# Patient Record
Sex: Female | Born: 1955 | Race: White | Hispanic: No | Smoking: Current every day smoker
Health system: Southern US, Community
[De-identification: ages and names within clinical notes are randomized; demographics above are authoritative.]

## PROBLEM LIST (undated history)

## (undated) DIAGNOSIS — M51369 Other intervertebral disc degeneration, lumbar region without mention of lumbar back pain or lower extremity pain: Secondary | ICD-10-CM

## (undated) DIAGNOSIS — I2510 Atherosclerotic heart disease of native coronary artery without angina pectoris: Secondary | ICD-10-CM

## (undated) DIAGNOSIS — I219 Acute myocardial infarction, unspecified: Secondary | ICD-10-CM

## (undated) DIAGNOSIS — J189 Pneumonia, unspecified organism: Secondary | ICD-10-CM

## (undated) DIAGNOSIS — E785 Hyperlipidemia, unspecified: Secondary | ICD-10-CM

## (undated) DIAGNOSIS — I639 Cerebral infarction, unspecified: Secondary | ICD-10-CM

## (undated) DIAGNOSIS — J449 Chronic obstructive pulmonary disease, unspecified: Secondary | ICD-10-CM

## (undated) DIAGNOSIS — G8929 Other chronic pain: Secondary | ICD-10-CM

## (undated) DIAGNOSIS — R42 Dizziness and giddiness: Secondary | ICD-10-CM

## (undated) DIAGNOSIS — Z9289 Personal history of other medical treatment: Secondary | ICD-10-CM

## (undated) DIAGNOSIS — M5136 Other intervertebral disc degeneration, lumbar region: Secondary | ICD-10-CM

## (undated) DIAGNOSIS — I10 Essential (primary) hypertension: Secondary | ICD-10-CM

## (undated) DIAGNOSIS — J45909 Unspecified asthma, uncomplicated: Secondary | ICD-10-CM

## (undated) DIAGNOSIS — M549 Dorsalgia, unspecified: Secondary | ICD-10-CM

## (undated) HISTORY — DX: Hyperlipidemia, unspecified: E78.5

## (undated) HISTORY — PX: CARPAL TUNNEL RELEASE: SHX101

## (undated) HISTORY — PX: CORONARY ANGIOPLASTY WITH STENT PLACEMENT: SHX49

## (undated) HISTORY — DX: Cerebral infarction, unspecified: I63.9

## (undated) HISTORY — DX: Unspecified asthma, uncomplicated: J45.909

## (undated) HISTORY — PX: ECTOPIC PREGNANCY SURGERY: SHX613

---

## 2012-07-09 ENCOUNTER — Encounter (HOSPITAL_COMMUNITY): Payer: Self-pay | Admitting: Emergency Medicine

## 2012-07-09 ENCOUNTER — Emergency Department (HOSPITAL_COMMUNITY): Payer: Medicare Other

## 2012-07-09 ENCOUNTER — Emergency Department (HOSPITAL_COMMUNITY)
Admission: EM | Admit: 2012-07-09 | Discharge: 2012-07-09 | Disposition: A | Discharge status: Home / Self Care | Payer: Medicare Other | Attending: Emergency Medicine | Admitting: Emergency Medicine

## 2012-07-09 DIAGNOSIS — Z8701 Personal history of pneumonia (recurrent): Secondary | ICD-10-CM | POA: Insufficient documentation

## 2012-07-09 DIAGNOSIS — Y939 Activity, unspecified: Secondary | ICD-10-CM | POA: Insufficient documentation

## 2012-07-09 DIAGNOSIS — Z87891 Personal history of nicotine dependence: Secondary | ICD-10-CM | POA: Insufficient documentation

## 2012-07-09 DIAGNOSIS — T07XXXA Unspecified multiple injuries, initial encounter: Secondary | ICD-10-CM | POA: Insufficient documentation

## 2012-07-09 DIAGNOSIS — I252 Old myocardial infarction: Secondary | ICD-10-CM | POA: Insufficient documentation

## 2012-07-09 DIAGNOSIS — W19XXXA Unspecified fall, initial encounter: Secondary | ICD-10-CM

## 2012-07-09 DIAGNOSIS — R51 Headache: Secondary | ICD-10-CM | POA: Insufficient documentation

## 2012-07-09 DIAGNOSIS — J449 Chronic obstructive pulmonary disease, unspecified: Secondary | ICD-10-CM | POA: Insufficient documentation

## 2012-07-09 DIAGNOSIS — Y9289 Other specified places as the place of occurrence of the external cause: Secondary | ICD-10-CM | POA: Insufficient documentation

## 2012-07-09 DIAGNOSIS — J4489 Other specified chronic obstructive pulmonary disease: Secondary | ICD-10-CM | POA: Insufficient documentation

## 2012-07-09 DIAGNOSIS — W010XXA Fall on same level from slipping, tripping and stumbling without subsequent striking against object, initial encounter: Secondary | ICD-10-CM | POA: Insufficient documentation

## 2012-07-09 DIAGNOSIS — Z9861 Coronary angioplasty status: Secondary | ICD-10-CM | POA: Insufficient documentation

## 2012-07-09 HISTORY — DX: Pneumonia, unspecified organism: J18.9

## 2012-07-09 HISTORY — DX: Chronic obstructive pulmonary disease, unspecified: J44.9

## 2012-07-09 HISTORY — DX: Acute myocardial infarction, unspecified: I21.9

## 2012-07-09 MED ORDER — NAPROXEN 500 MG PO TABS: 500.0000 mg | ORAL_TABLET | Freq: Two times a day (BID) | ORAL | Status: DC

## 2012-07-09 MED ORDER — TRAMADOL HCL 50 MG PO TABS
50.0000 mg | ORAL_TABLET | Freq: Once | ORAL | Status: AC
  Administered 2012-07-09: 50 mg via ORAL
  Filled 2012-07-09: qty 1

## 2012-07-09 MED ORDER — OXYCODONE-ACETAMINOPHEN 5-325 MG PO TABS
1.0000 | ORAL_TABLET | Freq: Once | ORAL | Status: DC
  Filled 2012-07-09: qty 1

## 2012-07-09 MED ORDER — OXYCODONE-ACETAMINOPHEN 5-325 MG PO TABS: 1.0000 | ORAL_TABLET | ORAL | Status: AC | PRN

## 2012-07-09 NOTE — ED Notes (Signed)
[facility]. States she slipped in the shower. C/o pain to the right side of her body.

## 2012-07-09 NOTE — ED Notes (Addendum)
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

## 2012-07-09 NOTE — ED Notes (Signed)
States that she continues with a headache, medication had no effect.  States that she is having a burning pain that runs down her right leg from her hip area.

## 2012-07-09 NOTE — ED Provider Notes (Signed)
History   This chart was scribed for Dione Booze, MD scribed by Magnus Sinning. The patient was seen in room [facility]03/[facility]03 at 18:33  CSN: [id number]  Arrival date & time 07/09/12  1816    Chief Complaint  Patient presents with  . Fall    (Consider location/radiation/quality/duration/timing/severity/associated sxs/prior treatment) HPI Comments: Vanessa Yoder is a 56 y.o. female who presents to the Emergency Department complaining of constnat moderate shooting right sided leg pain, onset today after a fall. She says the pain shoots from her right leg up into her right back. The [facility] and she says she slipped on some soap and landed on her right side. She currently rates the pain a 10 out of 10 and she states she has been unable to walk. 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   Patient is a 56 y.o. female presenting with fall. The history is provided by the patient. No language interpreter was used.  Fall The accident occurred 1 to 2 hours ago. The fall occurred while standing. She fell from an unknown height. There was no blood loss. The pain is at a severity of 10/10. The pain is moderate. She was not ambulatory at the scene. She has tried nothing for the symptoms.    Past Medical History  Diagnosis Date  . MI (myocardial infarction)   . COPD (chronic obstructive pulmonary disease)   . Pneumonia     Past Surgical History  Procedure Date  . Coronary angioplasty with stent placement   . Ectopic pregnancy surgery   . Cesarean section   . Carpal tunnel release     No family history on file.  History  Substance Use Topics  . Smoking status: Former Games developer  . Smokeless tobacco: Not on file  . Alcohol Use: No   Review of Systems  All other systems reviewed and are negative.   10 Systems reviewed and are negative for acute change except as noted in the HPI. Allergies  Demerol and Penicillins  Home Medications  No current  outpatient prescriptions on file.  BP 146/85  Pulse 73  Temp 97.5 F (36.4 C) (Oral)  Resp 20  Ht 5\' 3"  (1.6 m)  Wt 160 lb (72.576 kg)  BMI 28.34 kg/m2  SpO2 100%  Physical Exam  Nursing note and vitals reviewed. Constitutional: She is oriented to person, place, and time. She appears well-developed and well-nourished. No distress.  HENT:  Head: Normocephalic and atraumatic.  Eyes: Conjunctivae normal and EOM are normal.  Neck: Neck supple. No tracheal deviation present.  Cardiovascular: Normal rate.   Pulmonary/Chest: Effort normal. No respiratory distress.  Abdominal: She exhibits no distension.  Musculoskeletal: Normal range of motion. She exhibits tenderness.       Tender over entire thoracic and lumbar spine. Tender diffusely at the right leg. Pain with ROM of the right hip, knee and ankle. No swelling and no deformity  Neurological: She is alert and oriented to person, place, and time. No sensory deficit.  Skin: Skin is warm and dry.  Psychiatric: She has a normal mood and affect. Her behavior is normal.    ED Course  Procedures (including critical care time) DIAGNOSTIC STUDIES: Oxygen Saturation is 100% on room air, normal by my interpretation.    COORDINATION OF CARE: 18:35: Physical exam performed.  Dg Thoracic Spine 2 View  07/09/2012  *RADIOLOGY REPORT*  Clinical Data: Back pain following a fall.  THORACIC SPINE -  2 VIEW  Comparison: None.  Findings: Minimal anterior spur formation at multiple levels.  No fractures or subluxations.  Mild cervical spine degenerative changes.  IMPRESSION: No fracture or subluxation.  Mild degenerative changes.   Original Report Authenticated By: Beckie Salts, M.D.    Dg Lumbar Spine Complete  07/09/2012  *RADIOLOGY REPORT*  Clinical Data: Back pain following a fall.  LUMBAR SPINE - COMPLETE 4+ VIEW  Comparison: None.  Findings: Five non-rib bearing lumbar vertebrae.  Minimal levoconvex scoliosis.  No fractures, pars defects or  subluxations.  IMPRESSION: No fracture or subluxation.   Original Report Authenticated By: Beckie Salts, M.D.    Dg Pelvis 1-2 Views  07/09/2012  *RADIOLOGY REPORT*  Clinical Data: Right hip pain following a fall.  PELVIS - 1-2 VIEW  Comparison: None.  Findings: Mild pubic bone sclerosis and spur formation.  No fracture or dislocation.  IMPRESSION: No fracture or dislocation.   Original Report Authenticated By: Beckie Salts, M.D.    Dg Femur Right  07/09/2012  *RADIOLOGY REPORT*  Clinical Data: Right hip and upper femur pain.  RIGHT FEMUR - 2 VIEW  Comparison: None.  Findings: The right hip and knee are located.  No acute bone or soft tissue abnormality is present.  IMPRESSION: Negative right femur.   Original Report Authenticated By: Marin Roberts, M.D.    Dg Tibia/fibula Right  07/09/2012  *RADIOLOGY REPORT*  Clinical Data: Fall.  Pain.  RIGHT TIBIA AND FIBULA - 2 VIEW  Comparison: Femur films of the same day.  Findings: The knee and ankle joints are located.  No acute bone or soft tissue abnormality is present.  IMPRESSION: Negative right tibia and fibula.   Original Report Authenticated By: Marin Roberts, M.D.     Images viewed by me.    1. Fall   2. Contusion, multiple sites       MDM  Fall without evidence of serious injury. X-rays of been ordered. Patient started complaining of a headache and was worried that she was having another stroke, so CT scan of the head was obtained. All x-rays have come back negative for fractures lined any acute process. She is sent home with prescriptions for naproxen and Percocet.  I personally performed the services described in this documentation, which was scribed in my presence. The recorded information has been reviewed and is accurate.           Dione Booze, MD 07/09/12 2053

## 2012-07-09 NOTE — ED Notes (Signed)
Officer states that the patients are not allowed to have narcotic pain medications, MD made aware and Rx for Percocet voided.

## 2012-12-15 ENCOUNTER — Emergency Department (HOSPITAL_COMMUNITY): Payer: Medicare Other

## 2012-12-15 ENCOUNTER — Encounter (HOSPITAL_COMMUNITY): Payer: Self-pay | Admitting: *Deleted

## 2012-12-15 ENCOUNTER — Emergency Department (HOSPITAL_COMMUNITY)
Admission: EM | Admit: 2012-12-15 | Discharge: 2012-12-15 | Disposition: A | Discharge status: Home / Self Care | Payer: Medicare Other | Attending: Emergency Medicine | Admitting: Emergency Medicine

## 2012-12-15 DIAGNOSIS — Z7902 Long term (current) use of antithrombotics/antiplatelets: Secondary | ICD-10-CM | POA: Insufficient documentation

## 2012-12-15 DIAGNOSIS — J4489 Other specified chronic obstructive pulmonary disease: Secondary | ICD-10-CM | POA: Insufficient documentation

## 2012-12-15 DIAGNOSIS — Z9861 Coronary angioplasty status: Secondary | ICD-10-CM | POA: Insufficient documentation

## 2012-12-15 DIAGNOSIS — Z8701 Personal history of pneumonia (recurrent): Secondary | ICD-10-CM | POA: Insufficient documentation

## 2012-12-15 DIAGNOSIS — L509 Urticaria, unspecified: Secondary | ICD-10-CM | POA: Insufficient documentation

## 2012-12-15 DIAGNOSIS — L03311 Cellulitis of abdominal wall: Secondary | ICD-10-CM

## 2012-12-15 DIAGNOSIS — Z79899 Other long term (current) drug therapy: Secondary | ICD-10-CM | POA: Insufficient documentation

## 2012-12-15 DIAGNOSIS — H60399 Other infective otitis externa, unspecified ear: Secondary | ICD-10-CM | POA: Insufficient documentation

## 2012-12-15 DIAGNOSIS — L02219 Cutaneous abscess of trunk, unspecified: Secondary | ICD-10-CM | POA: Insufficient documentation

## 2012-12-15 DIAGNOSIS — I252 Old myocardial infarction: Secondary | ICD-10-CM | POA: Insufficient documentation

## 2012-12-15 DIAGNOSIS — Z87891 Personal history of nicotine dependence: Secondary | ICD-10-CM | POA: Insufficient documentation

## 2012-12-15 DIAGNOSIS — H6011 Cellulitis of right external ear: Secondary | ICD-10-CM

## 2012-12-15 DIAGNOSIS — J449 Chronic obstructive pulmonary disease, unspecified: Secondary | ICD-10-CM | POA: Insufficient documentation

## 2012-12-15 DIAGNOSIS — Z880 Allergy status to penicillin: Secondary | ICD-10-CM | POA: Insufficient documentation

## 2012-12-15 DIAGNOSIS — H81399 Other peripheral vertigo, unspecified ear: Secondary | ICD-10-CM | POA: Insufficient documentation

## 2012-12-15 LAB — POCT I-STAT, CHEM 8
Calcium, Ion: 1.19 mmol/L (ref 1.12–1.23)
Glucose, Bld: 105 mg/dL — ABNORMAL HIGH (ref 70–99)
HCT: 42 % (ref 36.0–46.0)
Hemoglobin: 14.3 g/dL (ref 12.0–15.0)

## 2012-12-15 MED ORDER — HYDROCODONE-ACETAMINOPHEN 5-325 MG PO TABS: ORAL_TABLET | ORAL | Status: DC

## 2012-12-15 MED ORDER — MECLIZINE HCL 12.5 MG PO TABS
25.0000 mg | ORAL_TABLET | Freq: Once | ORAL | Status: AC
  Administered 2012-12-15: 25 mg via ORAL
  Filled 2012-12-15: qty 2

## 2012-12-15 MED ORDER — MECLIZINE HCL 25 MG PO TABS: 25.0000 mg | ORAL_TABLET | Freq: Three times a day (TID) | ORAL | Status: DC | PRN

## 2012-12-15 MED ORDER — DIAZEPAM 5 MG PO TABS
5.0000 mg | ORAL_TABLET | Freq: Once | ORAL | Status: AC
  Administered 2012-12-15: 5 mg via ORAL
  Filled 2012-12-15: qty 1

## 2012-12-15 MED ORDER — OXYCODONE-ACETAMINOPHEN 5-325 MG PO TABS
1.0000 | ORAL_TABLET | Freq: Once | ORAL | Status: AC
  Administered 2012-12-15: 1 via ORAL
  Filled 2012-12-15: qty 1

## 2012-12-15 MED ORDER — DOXYCYCLINE HYCLATE 100 MG PO TABS: 100.0000 mg | ORAL_TABLET | Freq: Two times a day (BID) | ORAL | Status: DC

## 2012-12-15 MED ORDER — CLINDAMYCIN HCL 150 MG PO CAPS: ORAL_CAPSULE | ORAL | Status: DC

## 2012-12-15 NOTE — ED Notes (Signed)
Pt refusing CT due to claustrophobia.  edp notified.

## 2012-12-15 NOTE — ED Notes (Signed)
Pt given water and crackers per request

## 2012-12-15 NOTE — ED Provider Notes (Signed)
History     CSN: [id number]  Arrival date & time 12/15/12  0930   First MD Initiated Contact with Patient 12/15/12 [phone]      Chief Complaint  Patient presents with  . Rash  . Dizziness     HPI Pt was seen at 1000.   Per pt, c/o gradual onset and persistence of constant right and left ears "rash" that began 1 week ago.  Pt also c/o rash to lower abd wall that began several days ago, as well as "a burning sensation all over my body." States the rash began as "an itching spot" on her left ear, then "moved to my right ear" and "then down to my abd." Describes the rash as "itchy." Pt also c/o gradual onset and persistence of multiple intermittent episodes of "dizziness" for the past several days.  Pt describes the dizziness as "everything is moving around," worsens when she changes body or head positions.  Denies blistering rash to body or in mouth.  Denies fevers, no ecchymosis, no ears pain, no sore throat, no intra-oral lesions/rash/edema, no CP/SOB, no abd pain, no N/V/D, no focal motor weakness, no tingling/numbness in extremities, no visual changes, no facial droop, no slurred speech, no dysphagia, no neck pain.     Past Medical History  Diagnosis Date  . MI (myocardial infarction)   . COPD (chronic obstructive pulmonary disease)   . Pneumonia     Past Surgical History  Procedure Laterality Date  . Coronary angioplasty with stent placement    . Ectopic pregnancy surgery    . Cesarean section    . Carpal tunnel release       History  Substance Use Topics  . Smoking status: Former Games developer  . Smokeless tobacco: Not on file  . Alcohol Use: No    Review of Systems ROS: Statement: All systems negative except as marked or noted in the HPI; Constitutional: Negative for fever and chills. ; ; Eyes: Negative for eye pain, redness and discharge. ; ; ENMT: Negative for ear pain, hoarseness, nasal congestion, sinus pressure and sore throat. ; ; Cardiovascular: Negative for chest pain,  palpitations, diaphoresis, dyspnea and peripheral edema. ; ; Respiratory: Negative for cough, wheezing and stridor. ; ; Gastrointestinal: Negative for nausea, vomiting, diarrhea, abdominal pain, blood in stool, hematemesis, jaundice and rectal bleeding. . ; ; Genitourinary: Negative for dysuria, flank pain and hematuria. ; ; Musculoskeletal: Negative for back pain and neck pain. Negative for swelling and trauma.; ; Skin: +itching rash. Negative for abrasions, blisters, bruising and skin lesion.; ; Neuro: +dizziness, "burning all over body."  Negative for headache, lightheadedness and neck stiffness. Negative for weakness, altered level of consciousness , altered mental status, extremity weakness, involuntary movement, seizure and syncope.     Allergies  Demerol and Penicillins  Home Medications   Current Outpatient Rx  Name  Route  Sig  Dispense  Refill  . clopidogrel (PLAVIX) 75 MG tablet   Oral   Take 75 mg by mouth daily.         . furosemide (LASIX) 20 MG tablet   Oral   Take 20 mg by mouth daily.         Marland Kitchen lisinopril (PRINIVIL,ZESTRIL) 2.5 MG tablet   Oral   Take 2.5 mg by mouth daily.         . metoprolol tartrate (LOPRESSOR) 25 MG tablet   Oral   Take 25 mg by mouth 2 (two) times daily.         Marland Kitchen  montelukast (SINGULAIR) 10 MG tablet   Oral   Take 10 mg by mouth at bedtime.         . naproxen sodium (ANAPROX) 220 MG tablet   Oral   Take 220 mg by mouth daily as needed (pain).         . simvastatin (ZOCOR) 10 MG tablet   Oral   Take 10 mg by mouth at bedtime.         . clindamycin (CLEOCIN) 150 MG capsule      3 tabs PO TID x 10 days   90 capsule   0   . HYDROcodone-acetaminophen (NORCO/VICODIN) 5-325 MG per tablet      1 or 2 tabs PO q6 hours prn pain   20 tablet   0   . meclizine (ANTIVERT) 25 MG tablet   Oral   Take 1 tablet (25 mg total) by mouth 3 (three) times daily as needed for dizziness.   15 tablet   0     BP 132/80  Pulse 73   Temp(Src) 97.8 F (36.6 C) (Oral)  Ht 5\' 3"  (1.6 m)  Wt 150 lb (68.04 kg)  BMI 26.58 kg/m2  SpO2 97%  Physical Exam 1005: Physical examination:  Nursing notes reviewed; Vital signs and O2 SAT reviewed;  Constitutional: Well developed, Well nourished, Well hydrated, In no acute distress; Head:  Normocephalic, atraumatic; Eyes: EOMI, PERRL, No scleral icterus; ENMT: TM's clear bilat. Bilateral ear canals without rash, edema, debris or drainage.  Mouth and pharynx normal, Mucous membranes moist; Neck: Supple, Full range of motion, No lymphadenopathy; Cardiovascular: Regular rate and rhythm, No murmur, rub, or gallop; Respiratory: Breath sounds clear & equal bilaterally, No rales, rhonchi, wheezes.  Speaking full sentences with ease, Normal respiratory effort/excursion; Chest: Nontender, Movement normal; Abdomen: Soft, Nontender, Nondistended, Normal bowel sounds; Genitourinary: No CVA tenderness; Extremities: Pulses normal, No tenderness, No edema, No calf edema or asymmetry.; Neuro: AA&Ox3, Major CN grossly intact.  Strength 5/5 equal bilat UE's and LE's.  DTR 2/4 equal bilat UE's and LE's.  No gross sensory deficits.  Normal cerebellar testing bilat UE's (finger-nose) and LE's (heel-shin).  No pronator drift.  Speech clear.  No facial droop.  +right horizontal end-gaze fatigable nystagmus.;; Skin: Color normal, Warm, Dry; +urticarial rash that pt is scratching at during my exam to right side of head, posterior to right ear, lateral and anterior right neck without blisters, no open wounds, no drainage, no ecchymosis.  +3 small distinct areas of erythema to bilat lower anterior abd wall, no fluctuance, no drainage, no open wounds, no ecchymosis.  +left tragus with small scab.  +entire right pinnae and tragus with multiple scattered scabs, mild localized erythema, no blisters, no open wounds, no drainage, no ecchymosis.    ED Course  Procedures   MDM  MDM Reviewed: previous chart, nursing note and  vitals Interpretation: labs and CT scan   Results for orders placed during the hospital encounter of 12/15/12  POCT I-STAT, CHEM 8      Result Value Range   Sodium 138  135 - 145 mEq/L   Potassium 3.9  3.5 - 5.1 mEq/L   Chloride 105  96 - 112 mEq/L   BUN 13  6 - 23 mg/dL   Creatinine, Ser 1.61  0.50 - 1.10 mg/dL   Glucose, Bld 096 (*) 70 - 99 mg/dL   Calcium, Ion 0.45  4.09 - 1.23 mmol/L   TCO2 27  0 - 100 mmol/L  Hemoglobin 14.3  12.0 - 15.0 g/dL   HCT 86.5  78.4 - 69.6 %   Ct Head Wo Contrast 12/15/2012   *RADIOLOGY REPORT*  Clinical Data:  Dizziness  CT HEAD WITHOUT CONTRAST  Technique:  Contiguous axial images were obtained from the base of the skull through the vertex without contrast  Comparison:  07/09/2012  Findings:  The brain has a normal appearance without evidence for hemorrhage, acute infarction, hydrocephalus, or mass lesion.  There is no extra axial fluid collection.  The skull and paranasal sinuses are normal.  IMPRESSION: Normal CT of the head without contrast.   Original Report Authenticated By: Janeece Riggers, M.D.    1400:  Pt given antivert and valium (for anxiety before CT scan). States she feels "better now" and wants to go home.  Pt has ambulated with steady gait.  Not orthostatic.  Neuro exam continues intact.  Will tx symptomatically for vertigo.  Has tol PO well while in the ED without N/V.  Pt describes the right ear "pain" as "itching."  States she has "burning that radiates down and all over my body."  Right ear, as well as abd wall lesions, appears crusted and cellulitic. No blisters, no open wounds, no drainage, no abscess. Rash to right head and neck appears urticarial, no blisters.  Ears rash does not appear to be external otitis, as bilat ear canals are without rash, edema, debris, or drainage.  Doubt shingles without hx of or currently visualized blisters, no facial nerve palsy, no rash in ear canal itself, as well as rash is not in/has not been in a unilateral  dermatomal distribution (has been bilat ears, bilat lower anterior abd wall).   Will tx symptomatically for cellulitis and vertigo at this time.  Pt has gotten herself dressed and wants to go home now. Dx and testing d/w pt and family.  Questions answered.  Verb understanding, agreeable to d/c home with outpt f/u.          Laray Anger, DO 12/18/12 2224

## 2012-12-15 NOTE — ED Notes (Signed)
Rash to right ear with drainage at times. Also small rash to lower abdomen. Pt states deep, burning sensation all over her body. Balance has also been off, per pt.

## 2012-12-15 NOTE — ED Notes (Signed)
Pt initially said she seen stars when she got up but stated that dizziness was better. Pt said she felt that she ambulated fine. She then stated,"I don't care, I just want to get out of here and go home." NAD. Pt ambulated fine earlier from triage also.

## 2012-12-15 NOTE — ED Notes (Signed)
Patient walked in the hallway.  Patient could ambulate without assistance until "a episode of dizziness" - then she would require assistance. Patient stated that "dizziness comes and goes".

## 2012-12-15 NOTE — ED Notes (Signed)
Pt requesting something for pain.  edp notified, no orders received.

## 2012-12-27 ENCOUNTER — Emergency Department (HOSPITAL_COMMUNITY)
Admission: EM | Admit: 2012-12-27 | Discharge: 2012-12-27 | Disposition: A | Discharge status: Home / Self Care | Payer: Medicare Other | Attending: Emergency Medicine | Admitting: Emergency Medicine

## 2012-12-27 ENCOUNTER — Emergency Department (HOSPITAL_COMMUNITY): Payer: Medicare Other

## 2012-12-27 ENCOUNTER — Encounter (HOSPITAL_COMMUNITY): Payer: Self-pay | Admitting: *Deleted

## 2012-12-27 DIAGNOSIS — R110 Nausea: Secondary | ICD-10-CM | POA: Insufficient documentation

## 2012-12-27 DIAGNOSIS — Z885 Allergy status to narcotic agent status: Secondary | ICD-10-CM | POA: Insufficient documentation

## 2012-12-27 DIAGNOSIS — R0789 Other chest pain: Secondary | ICD-10-CM | POA: Insufficient documentation

## 2012-12-27 DIAGNOSIS — R0602 Shortness of breath: Secondary | ICD-10-CM | POA: Insufficient documentation

## 2012-12-27 DIAGNOSIS — J4489 Other specified chronic obstructive pulmonary disease: Secondary | ICD-10-CM | POA: Insufficient documentation

## 2012-12-27 DIAGNOSIS — Z8701 Personal history of pneumonia (recurrent): Secondary | ICD-10-CM | POA: Insufficient documentation

## 2012-12-27 DIAGNOSIS — Z8673 Personal history of transient ischemic attack (TIA), and cerebral infarction without residual deficits: Secondary | ICD-10-CM | POA: Insufficient documentation

## 2012-12-27 DIAGNOSIS — Z79899 Other long term (current) drug therapy: Secondary | ICD-10-CM | POA: Insufficient documentation

## 2012-12-27 DIAGNOSIS — I2510 Atherosclerotic heart disease of native coronary artery without angina pectoris: Secondary | ICD-10-CM | POA: Insufficient documentation

## 2012-12-27 DIAGNOSIS — R079 Chest pain, unspecified: Secondary | ICD-10-CM | POA: Insufficient documentation

## 2012-12-27 DIAGNOSIS — Z880 Allergy status to penicillin: Secondary | ICD-10-CM | POA: Insufficient documentation

## 2012-12-27 DIAGNOSIS — I252 Old myocardial infarction: Secondary | ICD-10-CM | POA: Insufficient documentation

## 2012-12-27 DIAGNOSIS — Z87891 Personal history of nicotine dependence: Secondary | ICD-10-CM | POA: Insufficient documentation

## 2012-12-27 DIAGNOSIS — J449 Chronic obstructive pulmonary disease, unspecified: Secondary | ICD-10-CM | POA: Insufficient documentation

## 2012-12-27 HISTORY — DX: Atherosclerotic heart disease of native coronary artery without angina pectoris: I25.10

## 2012-12-27 HISTORY — DX: Cerebral infarction, unspecified: I63.9

## 2012-12-27 LAB — CBC
HCT: 38.7 % (ref 36.0–46.0)
Hemoglobin: 14 g/dL (ref 12.0–15.0)
MCH: 32.9 pg (ref 26.0–34.0)
MCHC: 36.2 g/dL — ABNORMAL HIGH (ref 30.0–36.0)
RDW: 13.7 % (ref 11.5–15.5)

## 2012-12-27 LAB — BASIC METABOLIC PANEL
BUN: 16 mg/dL (ref 6–23)
Calcium: 9.4 mg/dL (ref 8.4–10.5)
GFR calc Af Amer: >90 mL/min (ref >90)
GFR calc non Af Amer: >90 mL/min (ref >90)
Glucose, Bld: 110 mg/dL — ABNORMAL HIGH (ref 70–99)

## 2012-12-27 MED ORDER — MORPHINE SULFATE 4 MG/ML IJ SOLN
4.0000 mg | Freq: Once | INTRAMUSCULAR | Status: AC
  Administered 2012-12-27: 4 mg via INTRAVENOUS
  Filled 2012-12-27: qty 1

## 2012-12-27 MED ORDER — ASPIRIN 81 MG PO CHEW
324.0000 mg | CHEWABLE_TABLET | Freq: Once | ORAL | Status: AC
  Administered 2012-12-27: 324 mg via ORAL
  Filled 2012-12-27: qty 4

## 2012-12-27 MED ORDER — ONDANSETRON HCL 4 MG/2ML IJ SOLN
4.0000 mg | Freq: Once | INTRAMUSCULAR | Status: AC
  Administered 2012-12-27: 4 mg via INTRAVENOUS
  Filled 2012-12-27: qty 2

## 2012-12-27 MED ORDER — CLOPIDOGREL BISULFATE 300 MG PO TABS
300.0000 mg | ORAL_TABLET | Freq: Once | ORAL | Status: AC
  Administered 2012-12-27: 300 mg via ORAL
  Filled 2012-12-27: qty 1

## 2012-12-27 MED ORDER — METOPROLOL SUCCINATE ER 50 MG PO TB24
50.0000 mg | ORAL_TABLET | Freq: Every day | ORAL | Status: DC
  Administered 2012-12-27: 50 mg via ORAL
  Filled 2012-12-27: qty 1

## 2012-12-27 MED ORDER — NITROGLYCERIN 0.4 MG SL SUBL
0.4000 mg | SUBLINGUAL_TABLET | SUBLINGUAL | Status: DC | PRN
  Administered 2012-12-27 (×2): 0.4 mg via SUBLINGUAL

## 2012-12-27 MED ORDER — GI COCKTAIL ~~LOC~~
30.0000 mL | Freq: Once | ORAL | Status: AC
  Administered 2012-12-27: 30 mL via ORAL
  Filled 2012-12-27: qty 30

## 2012-12-27 NOTE — ED Provider Notes (Signed)
History     CSN: [id number]  Arrival date & time 12/27/12  1731   First MD Initiated Contact with Patient 12/27/12 1751      Chief Complaint  Patient presents with  . Chest Pain  . Shortness of Breath    (Consider location/radiation/quality/duration/timing/severity/associated sxs/prior treatment) HPI  Patient is a 57 year old female past medical significant for MI (2009) with 3 stent placement (2009), CAD, COPD, history of DVT  presenting for three days of worsening central chest pressure with radiation to left arm with associated nausea and shortness of breath. Aggravating factors include movement, deep inspiration, palpation. No alleviating factors. Patient denies taking any aspirin. Patient states she has been off her Plavix for 10 days d/t antibiotic use. Patient also reports she has not followed up with cardiology since 2009 but was told she has another occluded vessel in her heart that would require stent placement. Denies diaphoresis, vomiting, new edema.   Past Medical History  Diagnosis Date  . MI (myocardial infarction)   . COPD (chronic obstructive pulmonary disease)   . Pneumonia   . Coronary artery disease   . Stroke     Past Surgical History  Procedure Laterality Date  . Coronary angioplasty with stent placement    . Ectopic pregnancy surgery    . Cesarean section    . Carpal tunnel release      No family history on file.  History  Substance Use Topics  . Smoking status: Former Games developer  . Smokeless tobacco: Not on file  . Alcohol Use: No    OB History   Grav Para Term Preterm Abortions TAB SAB Ect Mult Living                  Review of Systems  Constitutional: Negative for fever, chills and diaphoresis.  HENT: Negative for neck pain.   Eyes: Negative for visual disturbance.  Respiratory: Positive for chest tightness and shortness of breath. Negative for cough.   Cardiovascular: Positive for chest pain.  Gastrointestinal: Positive for nausea.  Negative for vomiting, abdominal pain and diarrhea.  Musculoskeletal: Negative for back pain.  Skin: Negative.   Neurological: Negative for headaches.  Psychiatric/Behavioral: The patient is not nervous/anxious.     Allergies  Demerol; Morphine and related; and Penicillins  Home Medications   Current Outpatient Rx  Name  Route  Sig  Dispense  Refill  . albuterol (PROVENTIL HFA;VENTOLIN HFA) 108 (90 BASE) MCG/ACT inhaler   Inhalation   Inhale 2 puffs into the lungs every 6 (six) hours as needed for wheezing.         Marland Kitchen atorvastatin (LIPITOR) 10 MG tablet   Oral   Take 10 mg by mouth daily.         . beclomethasone (QVAR) 80 MCG/ACT inhaler   Inhalation   Inhale 1 puff into the lungs 2 (two) times daily as needed (for shortness of breath).         . clindamycin (CLEOCIN) 150 MG capsule   Oral   Take 450 mg by mouth 3 (three) times daily.         . clopidogrel (PLAVIX) 75 MG tablet   Oral   Take 75 mg by mouth daily.         . furosemide (LASIX) 20 MG tablet   Oral   Take 20 mg by mouth daily.         Marland Kitchen lisinopril (PRINIVIL,ZESTRIL) 2.5 MG tablet   Oral   Take 2.5 mg by  mouth daily.         . meclizine (ANTIVERT) 25 MG tablet   Oral   Take 25 mg by mouth 3 (three) times daily as needed for dizziness.         . metoprolol succinate (TOPROL-XL) 50 MG 24 hr tablet   Oral   Take 50 mg by mouth daily. Take with or immediately following a meal.         . montelukast (SINGULAIR) 10 MG tablet   Oral   Take 10 mg by mouth at bedtime.         . naproxen sodium (ANAPROX) 220 MG tablet   Oral   Take 220 mg by mouth daily as needed (pain).           BP 119/53  Pulse 60  Temp(Src) 98.2 F (36.8 C) (Oral)  Resp 20  SpO2 96%  Physical Exam  Constitutional: She is oriented to person, place, and time. She appears well-developed and well-nourished. No distress.  HENT:  Head: Normocephalic and atraumatic.  Mouth/Throat: Oropharynx is clear and  moist.  Eyes: Conjunctivae are normal.  Neck: Neck supple.  Cardiovascular: Normal rate, regular rhythm, normal heart sounds and intact distal pulses.   Pulmonary/Chest: Effort normal and breath sounds normal. No respiratory distress. She exhibits tenderness.  Abdominal: Soft. Bowel sounds are normal. There is no tenderness.  Musculoskeletal: She exhibits no edema.  Neurological: She is alert and oriented to person, place, and time.  Skin: Skin is warm and dry. She is not diaphoretic. No erythema.    ED Course  Procedures (including critical care time)  Medications  nitroGLYCERIN (NITROSTAT) SL tablet 0.4 mg (0.4 mg Sublingual Given 12/27/12 1856)  metoprolol succinate (TOPROL-XL) 24 hr tablet 50 mg (50 mg Oral Given 12/27/12 1937)  aspirin chewable tablet 324 mg (324 mg Oral Given 12/27/12 1814)  morphine 4 MG/ML injection 4 mg (4 mg Intravenous Given 12/27/12 1856)  ondansetron (ZOFRAN) injection 4 mg (4 mg Intravenous Given 12/27/12 1856)  clopidogrel (PLAVIX) tablet 300 mg (300 mg Oral Given 12/27/12 1937)  morphine 4 MG/ML injection 4 mg (4 mg Intravenous Given 12/27/12 2011)  gi cocktail (Maalox,Lidocaine,Donnatal) (30 mLs Oral Given 12/27/12 2108)      Date: 12/27/2012  Rate: 75  Rhythm: normal sinus rhythm  QRS Axis: right  Intervals: normal  ST/T Wave abnormalities: normal  Conduction Disutrbances:none  Narrative Interpretation:   Old EKG Reviewed: none available    Labs Reviewed  CBC - Abnormal; Notable for the following:    MCHC 36.2 (*)    All other components within normal limits  BASIC METABOLIC PANEL - Abnormal; Notable for the following:    Glucose, Bld 110 (*)    All other components within normal limits  PRO B NATRIURETIC PEPTIDE  TROPONIN I  TROPONIN I  POCT I-STAT TROPONIN I   Dg Chest 2 View  12/27/2012   *RADIOLOGY REPORT*  Clinical Data: Chest pain, shortness of breath  CHEST - 2 VIEW  Comparison: 07/09/2012  Findings: Normal heart size and  vascularity.  Mild interstitial prominence without CHF or pneumonia.  Artifact overlies the upper lobes and the right lower chest.  No consolidation, pneumonia, edema, effusion or pneumothorax.  Trachea is midline.  IMPRESSION: No acute chest process.   Original Report Authenticated By: Judie Petit. Shick, M.D.     1. Chest pain       MDM  Patient is to be discharged with recommendation to follow up with PCP in regards  to today's hospital visit. Chest pain is not likely of cardiac or pulmonary etiology d/t presentation, VSS, no tracheal deviation, no JVD or new murmur, RRR, breath sounds equal bilaterally, EKG without acute abnormalities, negative troponins, and negative CXR. Cardiology evaluated patient and agreed patient could be discharged with follow up as outpatient. Pt has been advised to return to the ED is CP becomes exertional, associated with diaphoresis or nausea, radiates to left jaw/arm, worsens or becomes concerning in any way. Pt appears reliable for follow up and is agreeable to discharge. Case has been discussed with and seen by Dr. Manus Gunning who agrees with the above plan to discharge. Patient is stable at time of discharge          Jeannetta Ellis, PA-C 12/28/12 1055

## 2012-12-27 NOTE — ED Notes (Signed)
Rn to explain testing done in ED and emergency care and asked to follow up with cardiology. Pt sts she is upset about her pain still being present Dr. Manus Gunning explain testing to pt. RN explained negative troponins and pt told to come back with worsening symptoms. Pt sts she will never come back to a hospital in [place] again. RN apologetic.

## 2012-12-27 NOTE — Progress Notes (Signed)
Patient ID: Vanessa Yoder, female   DOB: Oct 02, 1955, 57 y.o.   MRN: [id number]   CARDIOLOGY CONSULT NOTE  Patient ID: Vanessa Yoder MRN: [id number], DOB/AGE: [birth year]   Admit date: 12/27/2012 Date of Consult: 12/27/2012   Primary Physician: Alice Reichert, MD Primary Cardiologist: none  Pt. Profile 57 year old white female with a history of coronary artery disease and chest discomfort for 3 days. I was asked by Dr. Manus Gunning to evaluate.   Problem List  Past Medical History  Diagnosis Date  . MI (myocardial infarction)   . COPD (chronic obstructive pulmonary disease)   . Pneumonia   . Coronary artery disease   . Stroke     Past Surgical History  Procedure Laterality Date  . Coronary angioplasty with stent placement    . Ectopic pregnancy surgery    . Cesarean section    . Carpal tunnel release       Allergies  Allergies  Allergen Reactions  . Demerol (Meperidine) Swelling  . Morphine And Related Hives and Rash  . Penicillins Rash    HPI   Patient comes in today with 3 days of constant chest discomfort. She describes as a sharp stabbing pain. She has not taken her Plavix in 10 days. She's also not taking any of her other heart medications as well. She says she was told not to take them with clindamycin by emergency room physician in [place]. I reviewed the records and do not see any note on that.  Her EKG is normal. First troponin is negative. Chest x-ray is significant for no acute cardiopulmonary disease.  She has a history of a myocardial infarction in [place] and says she has 3 stents. She continues to smoke on a daily basis.  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 This occurred in [place]. She states she obtained pain meds from [facility]. She states she is not a drug seeker.  Inpatient Medications  . metoprolol succinate  50 mg Oral Daily  .  morphine injection  4 mg Intravenous Once     Family History No family history on file.   Social History History   Social History  . Marital Status: Widowed    Spouse Name: N/A    Number of Children: N/A  . Years of Education: N/A   Occupational History  . Not on file.   Social History Main Topics  . Smoking status: Former Games developer  . Smokeless tobacco: Not on file  . Alcohol Use: No  . Drug Use: No  . Sexually Active: Not on file   Other Topics Concern  . Not on file   Social History Narrative  . No narrative on file     Review of Systems  General:  No chills, fever, night sweats or weight changes.  Cardiovascular:  No dyspnea on exertion, edema, orthopnea, palpitations, paroxysmal nocturnal dyspnea. Dermatological: No rash, lesions/masses Respiratory: No cough, dyspnea Urologic: No hematuria, dysuria Abdominal:   No nausea, vomiting, diarrhea, bright red blood per rectum, melena, or hematemesis Neurologic:  No visual changes, wkns, changes in mental status. All other systems reviewed and are otherwise negative except as noted above.  Physical Exam  Blood pressure 139/75, pulse 61, temperature 98.2 F (36.8 C), temperature source Oral, resp. rate 16, SpO2 96.00%.  General: Pleasant, NAD, skin warm and dry Psych: Normal affect. Neuro: Alert and oriented X 3. Moves all extremities spontaneously. HEENT: Normal  Neck: Supple without bruits or JVD. Lungs:  Resp regular and unlabored, inspiratory expiratory rhonchi Heart: RRR , normal S1-S2 Abdomen: Soft, non-tender, non-distended, BS + x 4.  Extremities: No clubbing, cyanosis or edema. DP/PT/Radials 2+ and equal bilaterally.  Labs   Recent Labs  12/27/12 1851  TROPONINI <0.30   Lab Results  Component Value Date   WBC 8.0 12/27/2012   HGB 14.0 12/27/2012   HCT 38.7 12/27/2012   MCV 91.1 12/27/2012   PLT 210 12/27/2012    Recent Labs Lab 12/27/12 1833  NA 137  K 3.8  CL 101  CO2 21  BUN 16  CREATININE 0.64  CALCIUM 9.4  GLUCOSE 110*   No  results found for this basename: CHOL, HDL, LDLCALC, TRIG   No results found for this basename: DDIMER    Radiology/Studies  Dg Chest 2 View  12/27/2012   *RADIOLOGY REPORT*  Clinical Data: Chest pain, shortness of breath  CHEST - 2 VIEW  Comparison: 07/09/2012  Findings: Normal heart size and vascularity.  Mild interstitial prominence without CHF or pneumonia.  Artifact overlies the upper lobes and the right lower chest.  No consolidation, pneumonia, edema, effusion or pneumothorax.  Trachea is midline.  IMPRESSION: No acute chest process.   Original Report Authenticated By: Judie Petit. Miles Costain, M.D.   Ct Head Wo Contrast  12/15/2012   *RADIOLOGY REPORT*  Clinical Data:  Dizziness  CT HEAD WITHOUT CONTRAST  Technique:  Contiguous axial images were obtained from the base of the skull through the vertex without contrast  Comparison:  07/09/2012  Findings:  The brain has a normal appearance without evidence for hemorrhage, acute infarction, hydrocephalus, or mass lesion.  There is no extra axial fluid collection.  The skull and paranasal sinuses are normal.  IMPRESSION: Normal CT of the head without contrast.   Original Report Authenticated By: Janeece Riggers, M.D.    ECG  Normal sinus rhythm, normal EKG  ASSESSMENT AND PLAN  #1 noncardiac chest pain. She has had 3 days of sharp stabbing pain it has a normal EKG and negative cardiac marker. Chest x-ray is also unremarkable. Discussed with Dr. Manus Gunning. We'll check one more cardiac enzymes and if negative will discharge home. Because she has not been taking her Plavix or other meds, we have loaded her with 300 mg of Plavix and given her her Toprol. She has also received aspirin. She's been strongly advised to stop smoking. I have also reinforced the importance of taking her meds on a daily basis. #2 tobacco use  #3 medical noncompliance  #4  appropriate use of pain medications. See history of present illness  Signed, Valera Castle, MD 12/27/2012, 8:09 PM

## 2012-12-27 NOTE — ED Notes (Signed)
RN entered to room to alert pt she was being discharged. Pt sts "I need to speak to the doctor because something is wrong with me and all hell will break loose if I get my son on the phone" Dr. Manus Gunning made aware.

## 2012-12-27 NOTE — ED Notes (Signed)
Pt has history of 3 cardiac stents and states that for the past three days has been having intermittent chest pain, sob, and nausea.

## 2012-12-27 NOTE — ED Notes (Signed)
Cardiologist- dr. Daleen Squibb at bedside.

## 2012-12-28 NOTE — ED Provider Notes (Signed)
Medical screening examination/treatment/procedure(s) were conducted as a shared visit with non-physician practitioner(s) and myself.  I personally evaluated the patient during the encounter  Constant chest pressure to L arm with nausea x 3 days.  Hx MI with stents placed in [place] in 2009. Has been off plavix.  EKG nsr.  Troponin negative. No tachycardia or hypoxia. D/w Dr. Daleen Squibb who has seen patient and agrees with serial enzymes and outpatient followup.  Glynn Octave, MD 12/28/12 1146

## 2013-01-07 ENCOUNTER — Encounter (HOSPITAL_COMMUNITY): Payer: Self-pay | Admitting: *Deleted

## 2013-01-07 ENCOUNTER — Emergency Department (HOSPITAL_COMMUNITY)
Admission: EM | Admit: 2013-01-07 | Discharge: 2013-01-07 | Disposition: A | Discharge status: Home / Self Care | Payer: Medicare Other | Attending: Emergency Medicine | Admitting: Emergency Medicine

## 2013-01-07 DIAGNOSIS — H60399 Other infective otitis externa, unspecified ear: Secondary | ICD-10-CM | POA: Insufficient documentation

## 2013-01-07 DIAGNOSIS — I2510 Atherosclerotic heart disease of native coronary artery without angina pectoris: Secondary | ICD-10-CM | POA: Insufficient documentation

## 2013-01-07 DIAGNOSIS — L989 Disorder of the skin and subcutaneous tissue, unspecified: Secondary | ICD-10-CM | POA: Insufficient documentation

## 2013-01-07 DIAGNOSIS — IMO0002 Reserved for concepts with insufficient information to code with codable children: Secondary | ICD-10-CM | POA: Insufficient documentation

## 2013-01-07 DIAGNOSIS — Z87891 Personal history of nicotine dependence: Secondary | ICD-10-CM | POA: Insufficient documentation

## 2013-01-07 DIAGNOSIS — M542 Cervicalgia: Secondary | ICD-10-CM | POA: Insufficient documentation

## 2013-01-07 DIAGNOSIS — Z79899 Other long term (current) drug therapy: Secondary | ICD-10-CM | POA: Insufficient documentation

## 2013-01-07 DIAGNOSIS — R599 Enlarged lymph nodes, unspecified: Secondary | ICD-10-CM | POA: Insufficient documentation

## 2013-01-07 DIAGNOSIS — J4489 Other specified chronic obstructive pulmonary disease: Secondary | ICD-10-CM | POA: Insufficient documentation

## 2013-01-07 DIAGNOSIS — Z792 Long term (current) use of antibiotics: Secondary | ICD-10-CM | POA: Insufficient documentation

## 2013-01-07 DIAGNOSIS — Z9861 Coronary angioplasty status: Secondary | ICD-10-CM | POA: Insufficient documentation

## 2013-01-07 DIAGNOSIS — H9209 Otalgia, unspecified ear: Secondary | ICD-10-CM | POA: Insufficient documentation

## 2013-01-07 DIAGNOSIS — R21 Rash and other nonspecific skin eruption: Secondary | ICD-10-CM | POA: Insufficient documentation

## 2013-01-07 DIAGNOSIS — Z8701 Personal history of pneumonia (recurrent): Secondary | ICD-10-CM | POA: Insufficient documentation

## 2013-01-07 DIAGNOSIS — Z8673 Personal history of transient ischemic attack (TIA), and cerebral infarction without residual deficits: Secondary | ICD-10-CM | POA: Insufficient documentation

## 2013-01-07 DIAGNOSIS — L089 Local infection of the skin and subcutaneous tissue, unspecified: Secondary | ICD-10-CM

## 2013-01-07 DIAGNOSIS — J449 Chronic obstructive pulmonary disease, unspecified: Secondary | ICD-10-CM | POA: Insufficient documentation

## 2013-01-07 DIAGNOSIS — I252 Old myocardial infarction: Secondary | ICD-10-CM | POA: Insufficient documentation

## 2013-01-07 DIAGNOSIS — Z7902 Long term (current) use of antithrombotics/antiplatelets: Secondary | ICD-10-CM | POA: Insufficient documentation

## 2013-01-07 DIAGNOSIS — Z880 Allergy status to penicillin: Secondary | ICD-10-CM | POA: Insufficient documentation

## 2013-01-07 DIAGNOSIS — H6093 Unspecified otitis externa, bilateral: Secondary | ICD-10-CM

## 2013-01-07 MED ORDER — HYDROCODONE-ACETAMINOPHEN 5-325 MG PO TABS: 1.0000 | ORAL_TABLET | ORAL | Status: DC | PRN

## 2013-01-07 MED ORDER — SULFAMETHOXAZOLE-TRIMETHOPRIM 800-160 MG PO TABS: 1.0000 | ORAL_TABLET | Freq: Two times a day (BID) | ORAL | Status: DC

## 2013-01-07 MED ORDER — NEOMYCIN-POLYMYXIN-HC 3.5-10000-1 OT SOLN
4.0000 [drp] | Freq: Once | OTIC | Status: AC
  Administered 2013-01-07: 4 [drp] via OTIC
  Filled 2013-01-07: qty 10

## 2013-01-07 NOTE — ED Provider Notes (Signed)
Medical screening examination/treatment/procedure(s) were conducted as a shared visit with non-physician practitioner(s) and myself.  I personally evaluated the patient during the encounter.  Right greater than left otitis externa and cellulitis right ear.  Rx Septra DS and Cortisporin otic  Donnetta Hutching, MD 01/07/13 [phone]

## 2013-01-07 NOTE — ED Notes (Signed)
Recurrent skin infections to bil ears x 1 wk.  States pain is radiating down neck and into chest.

## 2013-01-07 NOTE — ED Provider Notes (Signed)
History     CSN: [id number]  Arrival date & time 01/07/13  [id number]   First MD Initiated Contact with Patient 01/07/13 [phone]      Chief Complaint  Patient presents with  . Recurrent Skin Infections    (Consider location/radiation/quality/duration/timing/severity/associated sxs/prior treatment) Patient is a 57 y.o. female presenting with ear pain. The history is provided by the patient.  Otalgia Location:  Bilateral Behind ear:  Redness and swelling Quality:  Sore Severity:  Moderate Onset quality:  Gradual Duration:  1 week Timing:  Constant Progression:  Worsening Chronicity:  Recurrent Relieved by:  Nothing Worsened by:  Palpation Ineffective treatments:  None tried Associated symptoms: neck pain (swollen glands) and rash   Associated symptoms: no abdominal pain, no fever, no headaches, no sore throat and no vomiting    Vanessa Yoder is a 57 y.o. female who presents to the ED with skin infection. The infection is located on the external ears. The symptoms started one week ago. The pain goes down from her right ear to the neck. She was at [facility] a few days ago and had a complete cardiac work up for chest pain. She has a history of MI and stents. Her work up last week was negative for cardiac event. She did not have the skin infection at that time. She had been her a few weeks ago with the skin infection and treated with Clindamycin and the infection went almost away for 3 days and then started back. She complains of swollen glands and pain.  Past Medical History  Diagnosis Date  . MI (myocardial infarction)   . COPD (chronic obstructive pulmonary disease)   . Pneumonia   . Coronary artery disease   . Stroke     Past Surgical History  Procedure Laterality Date  . Coronary angioplasty with stent placement    . Ectopic pregnancy surgery    . Cesarean section    . Carpal tunnel release      No family history on file.  History  Substance Use Topics  . Smoking status:  Former Games developer  . Smokeless tobacco: Not on file  . Alcohol Use: No    OB History   Grav Para Term Preterm Abortions TAB SAB Ect Mult Living                  Review of Systems  Constitutional: Negative for fever and chills.  HENT: Positive for ear pain and neck pain (swollen glands). Negative for sore throat.   Respiratory: Negative for shortness of breath.   Gastrointestinal: Negative for nausea, vomiting and abdominal pain.  Musculoskeletal: Negative for back pain.  Skin: Positive for rash.  Neurological: Negative for headaches.  Psychiatric/Behavioral: The patient is not nervous/anxious.     Allergies  Demerol; Morphine and related; and Penicillins  Home Medications   Current Outpatient Rx  Name  Route  Sig  Dispense  Refill  . albuterol (PROVENTIL HFA;VENTOLIN HFA) 108 (90 BASE) MCG/ACT inhaler   Inhalation   Inhale 2 puffs into the lungs every 6 (six) hours as needed for wheezing.         Marland Kitchen atorvastatin (LIPITOR) 10 MG tablet   Oral   Take 10 mg by mouth daily.         . beclomethasone (QVAR) 80 MCG/ACT inhaler   Inhalation   Inhale 1 puff into the lungs 2 (two) times daily as needed (for shortness of breath).         . clindamycin (CLEOCIN)  150 MG capsule   Oral   Take 450 mg by mouth 3 (three) times daily.         . clopidogrel (PLAVIX) 75 MG tablet   Oral   Take 75 mg by mouth daily.         . furosemide (LASIX) 20 MG tablet   Oral   Take 20 mg by mouth daily.         Marland Kitchen lisinopril (PRINIVIL,ZESTRIL) 2.5 MG tablet   Oral   Take 2.5 mg by mouth daily.         . meclizine (ANTIVERT) 25 MG tablet   Oral   Take 25 mg by mouth 3 (three) times daily as needed for dizziness.         . metoprolol succinate (TOPROL-XL) 50 MG 24 hr tablet   Oral   Take 50 mg by mouth daily. Take with or immediately following a meal.         . montelukast (SINGULAIR) 10 MG tablet   Oral   Take 10 mg by mouth at bedtime.         . naproxen sodium  (ANAPROX) 220 MG tablet   Oral   Take 220 mg by mouth daily as needed (pain).           BP 135/65  Pulse 70  Temp(Src) 97.6 F (36.4 C) (Oral)  Resp 20  SpO2 96%  Physical Exam  Nursing note and vitals reviewed. Constitutional: She is oriented to person, place, and time. She appears well-developed and well-nourished.  HENT:  Right Ear: There is drainage.  Left Ear: There is drainage.  Bilateral external ear canals with swelling and drainage. Tender with palpation, right >left. Unable to visualize TM's due to swelling and drainage.  Eyes: EOM are normal.  Neck: Neck supple.  Cardiovascular: Normal rate and regular rhythm.   Pulmonary/Chest: Effort normal.  Abdominal: Soft. There is no tenderness.  Musculoskeletal: Normal range of motion.  Lymphadenopathy:    She has cervical adenopathy.  Neurological: She is alert and oriented to person, place, and time. No cranial nerve deficit.  Skin: Skin is warm and dry.  Psychiatric: She has a normal mood and affect. Her behavior is normal.    ED Course  Procedures (including critical care time)  MDM  57 y.o. female with bilateral otitis externa and skin infection. Discussed with Dr. Adriana Simas and will treat with antibiotics and Cortisporin Otic which we will instill the first dose here.  Discussed with the patient and all questioned fully answered. She will return if any problems arise. \   Medication List    TAKE these medications       HYDROcodone-acetaminophen 5-325 MG per tablet  Commonly known as:  NORCO/VICODIN  Take 1 tablet by mouth every 4 (four) hours as needed.     sulfamethoxazole-trimethoprim 800-160 MG per tablet  Commonly known as:  SEPTRA DS  Take 1 tablet by mouth every 12 (twelve) hours.      ASK your doctor about these medications       albuterol 108 (90 BASE) MCG/ACT inhaler  Commonly known as:  PROVENTIL HFA;VENTOLIN HFA  Inhale 2 puffs into the lungs every 6 (six) hours as needed for wheezing.      atorvastatin 10 MG tablet  Commonly known as:  LIPITOR  Take 10 mg by mouth daily.     beclomethasone 80 MCG/ACT inhaler  Commonly known as:  QVAR  Inhale 1 puff into the lungs 2 (two)  times daily as needed (for shortness of breath).     clopidogrel 75 MG tablet  Commonly known as:  PLAVIX  Take 75 mg by mouth daily.     furosemide 20 MG tablet  Commonly known as:  LASIX  Take 20 mg by mouth daily.     lisinopril 2.5 MG tablet  Commonly known as:  PRINIVIL,ZESTRIL  Take 2.5 mg by mouth daily.     meclizine 25 MG tablet  Commonly known as:  ANTIVERT  Take 25 mg by mouth 3 (three) times daily as needed for dizziness.     metoprolol succinate 50 MG 24 hr tablet  Commonly known as:  TOPROL-XL  Take 50 mg by mouth daily. Take with or immediately following a meal.     montelukast 10 MG tablet  Commonly known as:  SINGULAIR  Take 10 mg by mouth daily after breakfast.        Janne Napoleon, NP 01/07/13 [address] [place], NP 01/07/13 1607

## 2013-01-19 ENCOUNTER — Other Ambulatory Visit (HOSPITAL_COMMUNITY): Payer: Self-pay | Admitting: Family Medicine

## 2013-01-19 DIAGNOSIS — R1310 Dysphagia, unspecified: Secondary | ICD-10-CM

## 2013-01-23 ENCOUNTER — Ambulatory Visit (HOSPITAL_COMMUNITY)
Admission: RE | Admit: 2013-01-23 | Discharge: 2013-01-23 | Disposition: A | Discharge status: Home / Self Care | Payer: Medicare Other | Source: Ambulatory Visit | Attending: Family Medicine | Admitting: Family Medicine

## 2013-01-23 ENCOUNTER — Other Ambulatory Visit (HOSPITAL_COMMUNITY): Payer: Self-pay | Admitting: Family Medicine

## 2013-01-23 DIAGNOSIS — R1310 Dysphagia, unspecified: Secondary | ICD-10-CM

## 2013-01-23 DIAGNOSIS — J4489 Other specified chronic obstructive pulmonary disease: Secondary | ICD-10-CM | POA: Insufficient documentation

## 2013-01-23 DIAGNOSIS — R079 Chest pain, unspecified: Secondary | ICD-10-CM | POA: Insufficient documentation

## 2013-01-23 DIAGNOSIS — Z87891 Personal history of nicotine dependence: Secondary | ICD-10-CM

## 2013-01-23 DIAGNOSIS — J449 Chronic obstructive pulmonary disease, unspecified: Secondary | ICD-10-CM | POA: Insufficient documentation

## 2013-01-31 DIAGNOSIS — Z9289 Personal history of other medical treatment: Secondary | ICD-10-CM

## 2013-01-31 HISTORY — DX: Personal history of other medical treatment: Z92.89

## 2013-02-01 ENCOUNTER — Encounter: Payer: Self-pay | Admitting: *Deleted

## 2013-02-01 DIAGNOSIS — I639 Cerebral infarction, unspecified: Secondary | ICD-10-CM

## 2013-02-01 DIAGNOSIS — J449 Chronic obstructive pulmonary disease, unspecified: Secondary | ICD-10-CM | POA: Insufficient documentation

## 2013-02-01 DIAGNOSIS — I25810 Atherosclerosis of coronary artery bypass graft(s) without angina pectoris: Secondary | ICD-10-CM | POA: Insufficient documentation

## 2013-02-02 ENCOUNTER — Ambulatory Visit (INDEPENDENT_AMBULATORY_CARE_PROVIDER_SITE_OTHER): Payer: Medicare Other | Admitting: Internal Medicine

## 2013-02-02 ENCOUNTER — Encounter: Payer: Self-pay | Admitting: *Deleted

## 2013-02-02 ENCOUNTER — Encounter: Payer: Self-pay | Admitting: Internal Medicine

## 2013-02-02 VITALS — BP 115/79 | HR 70 | Ht 63.0 in | Wt 159.1 lb

## 2013-02-02 DIAGNOSIS — R0602 Shortness of breath: Secondary | ICD-10-CM

## 2013-02-02 DIAGNOSIS — R079 Chest pain, unspecified: Secondary | ICD-10-CM

## 2013-02-02 DIAGNOSIS — R0989 Other specified symptoms and signs involving the circulatory and respiratory systems: Secondary | ICD-10-CM

## 2013-02-02 NOTE — Progress Notes (Signed)
HPI Patient is a 57 yo who presents for continued cardiac care Recently seen for draining ear  While on antibiotics developed chest pains.   Chest pain occasional L sided lasts seconds.  Substernal chest pain comes and goes  May be associated with activity Has history of COPD and chronic bronchitis  Patient has a history of CAD  While in [place] she suffered anMI in 2009  Initially woke up felt train in chest.  Very SOB.  Waited to go to doctor.  Went to [facility] hospital  Taxus stents x 3 to ostial, prox and mid RCA. (Dr Seward Speck at [facility] 2/09)  Current pain is different than MI Pain  Lipitor  Chol still high  Stroke in 2000  ? Stress.  Right side of body weak.    Allergies  Allergen Reactions  . Demerol (Meperidine) Swelling  . Morphine And Related Hives and Rash  . Penicillins Rash    Current Outpatient Prescriptions  Medication Sig Dispense Refill  . albuterol (PROVENTIL HFA;VENTOLIN HFA) 108 (90 BASE) MCG/ACT inhaler Inhale 2 puffs into the lungs every 6 (six) hours as needed for wheezing.      Marland Kitchen atorvastatin (LIPITOR) 10 MG tablet Take 10 mg by mouth daily.      . beclomethasone (QVAR) 80 MCG/ACT inhaler Inhale 1 puff into the lungs 2 (two) times daily as needed (for shortness of breath).      . clopidogrel (PLAVIX) 75 MG tablet Take 75 mg by mouth daily.      . furosemide (LASIX) 20 MG tablet Take 20 mg by mouth daily.      Marland Kitchen lisinopril (PRINIVIL,ZESTRIL) 2.5 MG tablet Take 2.5 mg by mouth daily.      . metoprolol succinate (TOPROL-XL) 50 MG 24 hr tablet Take 50 mg by mouth daily. Take with or immediately following a meal.      . montelukast (SINGULAIR) 10 MG tablet Take 10 mg by mouth daily after breakfast.        No current facility-administered medications for this visit.    Past Medical History  Diagnosis Date  . MI (myocardial infarction)   . COPD (chronic obstructive pulmonary disease)   . Pneumonia   . Coronary artery disease   . Stroke     Past  Surgical History  Procedure Laterality Date  . Coronary angioplasty with stent placement    . Ectopic pregnancy surgery    . Cesarean section    . Carpal tunnel release      Family History  Problem Relation Age of Onset  . Lung cancer Mother     History   Social History  . Marital Status: Widowed    Spouse Name: N/A    Number of Children: N/A  . Years of Education: N/A   Occupational History  . Not on file.   Social History Main Topics  . Smoking status: Former Games developer  . Smokeless tobacco: Not on file  . Alcohol Use: No  . Drug Use: No  . Sexually Active: Not on file   Other Topics Concern  . Not on file   Social History Narrative  . No narrative on file    Review of Systems:  All systems reviewed.  They are negative to the above problem except as previously stated.  Vital Signs: BP 124/77  Pulse 70  Ht 5\' 3"  (1.6 m)  Wt 159 lb 1.3 oz (72.158 kg)  BMI 28.19 kg/m2  Physical Exam Patient is in NAD HEENT:  Normocephalic, atraumatic. EOMI, PERRLA.  Neck: JVP is normal.  + bruits.  Lungs: Relatively CTA No rales no wheezes.  Heart: Regular rate and rhythm. Normal S1, S2. No S3.   No significant murmurs. PMI not displaced.  Abdomen:  Supple, nontender. Normal bowel sounds. No masses. No hepatomegaly.  Extremities:   Good distal pulses throughout. No lower extremity edema.  Musculoskeletal :moving all extremities.  Neuro:   alert and oriented x3.  CN II-XII grossly intact.  EKG  SR 70   Assessment and Plan:  1.  CP  Patient with history of CAD  Continued risk factors.  CP is different that with MI.  COncerning. I would recomm echo to evaluate LV/RV function, est pulmonary pressures.  I would also recomm a stress myoview to evaluate for inducible ischemi  2.  Carotid bruits.  Will set up for carotid USN  3.  HL  Encouraged her to stay on lipitor  Will need to f/u on lipids.

## 2013-02-02 NOTE — Patient Instructions (Addendum)
Your physician recommends that you schedule a follow-up appointment in: to be determined Dr Tenny Craw will call you with test results  Your physician has requested that you have an echocardiogram. Echocardiography is a painless test that uses sound waves to create images of your heart. It provides your doctor with information about the size and shape of your heart and how well your heart's chambers and valves are working. This procedure takes approximately one hour. There are no restrictions for this procedure.  Your physician has requested that you have a carotid duplex. This test is an ultrasound of the carotid arteries in your neck. It looks at blood flow through these arteries that supply the brain with blood. Allow one hour for this exam. There are no restrictions or special instructions.  Your physician has requested that you have en exercise stress myoview. For further information please visit [email or web address] Please follow instruction sheet, as given.

## 2013-02-07 ENCOUNTER — Ambulatory Visit (HOSPITAL_COMMUNITY)
Admission: RE | Admit: 2013-02-07 | Discharge: 2013-02-07 | Disposition: A | Discharge status: Home / Self Care | Payer: Medicare Other | Source: Ambulatory Visit | Attending: Internal Medicine | Admitting: Internal Medicine

## 2013-02-07 ENCOUNTER — Encounter: Payer: Self-pay | Admitting: Internal Medicine

## 2013-02-07 DIAGNOSIS — R079 Chest pain, unspecified: Secondary | ICD-10-CM | POA: Insufficient documentation

## 2013-02-07 DIAGNOSIS — R0989 Other specified symptoms and signs involving the circulatory and respiratory systems: Secondary | ICD-10-CM | POA: Insufficient documentation

## 2013-02-07 DIAGNOSIS — R072 Precordial pain: Secondary | ICD-10-CM

## 2013-02-07 DIAGNOSIS — R0609 Other forms of dyspnea: Secondary | ICD-10-CM | POA: Insufficient documentation

## 2013-02-07 DIAGNOSIS — J449 Chronic obstructive pulmonary disease, unspecified: Secondary | ICD-10-CM | POA: Insufficient documentation

## 2013-02-07 DIAGNOSIS — J4489 Other specified chronic obstructive pulmonary disease: Secondary | ICD-10-CM | POA: Insufficient documentation

## 2013-02-07 NOTE — Progress Notes (Signed)
*  PRELIMINARY RESULTS* Echocardiogram 2D Echocardiogram has been performed.  Conrad Loraine 02/07/2013, 1:28 PM

## 2013-02-08 ENCOUNTER — Encounter (HOSPITAL_COMMUNITY)
Admission: RE | Admit: 2013-02-08 | Discharge: 2013-02-08 | Disposition: A | Discharge status: Home / Self Care | Payer: Medicare Other | Source: Ambulatory Visit | Attending: Internal Medicine | Admitting: Internal Medicine

## 2013-02-08 ENCOUNTER — Encounter (HOSPITAL_COMMUNITY): Payer: Self-pay

## 2013-02-08 ENCOUNTER — Ambulatory Visit (HOSPITAL_COMMUNITY)
Admission: RE | Admit: 2013-02-08 | Discharge: 2013-02-08 | Disposition: A | Discharge status: Home / Self Care | Payer: Medicare Other | Source: Ambulatory Visit | Attending: Internal Medicine | Admitting: Internal Medicine

## 2013-02-08 DIAGNOSIS — I2510 Atherosclerotic heart disease of native coronary artery without angina pectoris: Secondary | ICD-10-CM

## 2013-02-08 DIAGNOSIS — R079 Chest pain, unspecified: Secondary | ICD-10-CM | POA: Insufficient documentation

## 2013-02-08 DIAGNOSIS — I252 Old myocardial infarction: Secondary | ICD-10-CM | POA: Insufficient documentation

## 2013-02-08 DIAGNOSIS — R0989 Other specified symptoms and signs involving the circulatory and respiratory systems: Secondary | ICD-10-CM

## 2013-02-08 DIAGNOSIS — R0602 Shortness of breath: Secondary | ICD-10-CM

## 2013-02-08 HISTORY — DX: Essential (primary) hypertension: I10

## 2013-02-08 MED ORDER — TECHNETIUM TC 99M SESTAMIBI - CARDIOLITE
10.0000 | Freq: Once | INTRAVENOUS | Status: AC | PRN
  Administered 2013-02-08: 10 via INTRAVENOUS

## 2013-02-08 MED ORDER — REGADENOSON 0.4 MG/5ML IV SOLN
INTRAVENOUS | Status: AC
  Administered 2013-02-08: 0.4 mg via INTRAVENOUS
  Filled 2013-02-08: qty 5

## 2013-02-08 MED ORDER — SODIUM CHLORIDE 0.9 % IJ SOLN
INTRAMUSCULAR | Status: AC
  Administered 2013-02-08: 10 mL via INTRAVENOUS
  Filled 2013-02-08: qty 10

## 2013-02-08 MED ORDER — TECHNETIUM TC 99M SESTAMIBI - CARDIOLITE
30.0000 | Freq: Once | INTRAVENOUS | Status: AC | PRN
  Administered 2013-02-08: 31 via INTRAVENOUS

## 2013-02-08 NOTE — Progress Notes (Signed)
Stress Lab Nurses Notes - [name] 02/08/2013 Reason for doing test: Chest Pain and Dyspnea Type of test: Marlane Hatcher Nurse performing test: Parke Poisson, RN Nuclear Medicine Tech: Lou Cal Echo Tech: Not Applicable MD performing test: Ival Bible & Joni Reining NP Family MD: Dr. Renard Matter Test explained and consent signed: yes IV started: 22g jelco, Saline lock flushed, No redness or edema and Saline lock started in radiology Symptoms: SOB, Chest pressure & Headache Treatment/Intervention: None Reason test stopped: protocol completed After recovery IV was: Discontinued via X-ray tech and No redness or edema Patient to return to Nuc. Med at : 11:30 Patient discharged: Home Patient's Condition upon discharge was: stable Comments: During test BP 146/82 & HR 121.  Recovery BP 126/78 & HR 83.  Symptoms resolved in recovery except headache. Erskine Speed T

## 2013-02-10 ENCOUNTER — Other Ambulatory Visit: Payer: Self-pay | Admitting: *Deleted

## 2013-02-10 DIAGNOSIS — I6523 Occlusion and stenosis of bilateral carotid arteries: Secondary | ICD-10-CM

## 2013-02-13 ENCOUNTER — Telehealth: Payer: Self-pay | Admitting: Internal Medicine

## 2013-02-13 NOTE — Telephone Encounter (Signed)
Would like results of tests / tgs

## 2013-02-13 NOTE — Telephone Encounter (Signed)
Called pt times two to advise results however the vm box has not been set up yet, will try to call pt again

## 2013-02-16 ENCOUNTER — Other Ambulatory Visit: Payer: Self-pay | Admitting: *Deleted

## 2013-02-17 ENCOUNTER — Encounter: Payer: Self-pay | Admitting: *Deleted

## 2013-02-22 NOTE — Telephone Encounter (Signed)
Pt aware results and VVS upcoming apt

## 2013-02-23 ENCOUNTER — Encounter: Payer: Self-pay | Admitting: Obstetrics and Gynecology

## 2013-02-27 ENCOUNTER — Encounter: Payer: Self-pay | Admitting: Vascular Surgery

## 2013-02-28 ENCOUNTER — Ambulatory Visit (INDEPENDENT_AMBULATORY_CARE_PROVIDER_SITE_OTHER): Payer: Medicare Other | Admitting: Vascular Surgery

## 2013-02-28 ENCOUNTER — Encounter: Payer: Self-pay | Admitting: Vascular Surgery

## 2013-02-28 ENCOUNTER — Other Ambulatory Visit (INDEPENDENT_AMBULATORY_CARE_PROVIDER_SITE_OTHER): Payer: Medicare Other | Admitting: *Deleted

## 2013-02-28 DIAGNOSIS — I6529 Occlusion and stenosis of unspecified carotid artery: Secondary | ICD-10-CM

## 2013-02-28 DIAGNOSIS — F17200 Nicotine dependence, unspecified, uncomplicated: Secondary | ICD-10-CM

## 2013-02-28 NOTE — Progress Notes (Signed)
Subjective:     Patient ID: Vanessa Yoder, female   DOB: [birth year], 57 y.o.   MRN: [id number]  HPI this 57 year old female referred by Dr. 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 She recently had a carotid ultrasound study performed at [facility] which suggested a greater than 70% ICA stenosis on the left and possibly on the right. 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 That has pretty much resolved over the years although it was fairly severe initially. She has had no new neurologic symptoms. She does continue to smoke at least 5 or 6 cigarettes per day and has a 30+ year history of tobacco abuse. She also has a history of coronary artery disease with PTCA and stents and is followed by Dr. Tenny Craw and appears to be stable at this time. He denies lateralizing weakness, aphasia, amaurosis fugax, or syncope. She does have some dizziness and blurred vision.  Past Medical History  Diagnosis Date  . MI (myocardial infarction)   . COPD (chronic obstructive pulmonary disease)   . Pneumonia   . Coronary artery disease   . Stroke   . Hypertension   . CVA (cerebral infarction)   . Hyperlipidemia   . Asthma     History  Substance Use Topics  . Smoking status: Current Every Day Smoker -- 35 years    Types: Cigarettes  . Smokeless tobacco: Never Used  . Alcohol Use: No    Family History  Problem Relation Age of Onset  . Lung cancer Mother   . Cancer Mother   . Cancer Maternal Aunt     breast  . Heart disease Father     Allergies  Allergen Reactions  . Demerol (Meperidine) Swelling  . Morphine And Related Hives and Rash  . Penicillins Rash    Current outpatient prescriptions:albuterol (PROVENTIL HFA;VENTOLIN HFA) 108 (90 BASE) MCG/ACT inhaler, Inhale 2 puffs into the lungs every 6 (six) hours as needed for wheezing., Disp: , Rfl: ;  atorvastatin (LIPITOR) 10 MG tablet, Take 10 mg by mouth daily., Disp: , Rfl: ;   beclomethasone (QVAR) 80 MCG/ACT inhaler, Inhale 1 puff into the lungs 2 (two) times daily as needed (for shortness of breath)., Disp: , Rfl:  clopidogrel (PLAVIX) 75 MG tablet, Take 75 mg by mouth daily., Disp: , Rfl: ;  furosemide (LASIX) 20 MG tablet, Take 20 mg by mouth daily., Disp: , Rfl: ;  lisinopril (PRINIVIL,ZESTRIL) 2.5 MG tablet, Take 2.5 mg by mouth daily., Disp: , Rfl: ;  metoprolol succinate (TOPROL-XL) 50 MG 24 hr tablet, Take 50 mg by mouth daily. Take with or immediately following a meal., Disp: , Rfl:  montelukast (SINGULAIR) 10 MG tablet, Take 10 mg by mouth daily after breakfast. , Disp: , Rfl:   BP 169/93  Pulse 71  Resp 16  Ht 5\' 3"  (1.6 m)  Wt 160 lb (72.576 kg)  BMI 28.35 kg/m2  Body mass index is 28.35 kg/(m^2).          Review of Systems has occasional chest discomfort, orthopnea, dyspnea on exertion, lower extremity edema, productive cough, asthma, numbness in arms and legs, and dizziness. All other systems negative and complete review of systems    Objective:   Physical Exam blood pressure 169/93 heart rate 71 respirations 16 Gen.-alert and oriented x3 in no apparent distress HEENT normal for age Lungs no rhonchi or wheezing Cardiovascular regular rhythm no murmurs carotid pulses 3+ palpable no bruits  audible Abdomen soft nontender no palpable masses Musculoskeletal free of  major deformities Skin clear -no rashes Neurologic normal Lower extremities 3+ femoral and dorsalis pedis pulses palpable bilaterally with no edema  Data ordered a carotid duplex exam which I reviewed and interpreted and compared to the previous study. He does not appear to me that she has greater than 60% ICA stenosis bilaterally. She does have a severe right external carotid stenosis she is of no significance.      Assessment:     #1 bilateral moderate ICA stenosis-currently asymptomatic-remote history of left brain stroke 14 years ago #2 ongoing tobacco abuse #3 coronary  artery disease status post PTCA and stenting    Plan:     Returns see [place] in one year with carotid duplex exam in our office to compare to today's study unless she develops new symptoms in the interim

## 2013-03-07 ENCOUNTER — Other Ambulatory Visit: Payer: Self-pay | Admitting: *Deleted

## 2013-03-17 ENCOUNTER — Encounter: Payer: Self-pay | Admitting: Obstetrics & Gynecology

## 2013-03-28 ENCOUNTER — Ambulatory Visit (INDEPENDENT_AMBULATORY_CARE_PROVIDER_SITE_OTHER): Payer: Medicare Other | Admitting: Obstetrics & Gynecology

## 2013-03-28 ENCOUNTER — Encounter: Payer: Self-pay | Admitting: Obstetrics & Gynecology

## 2013-03-28 VITALS — BP 120/80 | Ht 65.2 in | Wt 160.0 lb

## 2013-03-28 DIAGNOSIS — N951 Menopausal and female climacteric states: Secondary | ICD-10-CM

## 2013-03-28 DIAGNOSIS — N879 Dysplasia of cervix uteri, unspecified: Secondary | ICD-10-CM

## 2013-03-28 DIAGNOSIS — IMO0002 Reserved for concepts with insufficient information to code with codable children: Secondary | ICD-10-CM

## 2013-03-28 NOTE — Progress Notes (Signed)
Patient ID: Vanessa Yoder, female   DOB: Sep 20, 1955, 57 y.o.   MRN: [id number] Vanessa Yoder is in today as a referral from Dr. Megan Mans  She had a Pap smear back in June which revealed atypical squamous cells of undetermined significant but had a negative high-risk HPV as well  She was sent for evaluation of her Pap and possible colposcopy but she does not need a colposcopic evaluation today based in the cytology and HPV  I recommended that she ever retain cytological evaluation next Raphael Gibney which would be one year from her last Pap smear and then reflex HPV based on the cytological findings  Also discussed issues of vaginal dryness and associated dyspareunia and recommended [place] warming and or possibly future use of vaginal estrogen to reinvigorate the vagina  She voices understanding and will call back if she walks to use the vaginal estrogen if the [place] warming is still ineffective

## 2013-04-19 ENCOUNTER — Ambulatory Visit (HOSPITAL_COMMUNITY)
Admission: RE | Admit: 2013-04-19 | Discharge: 2013-04-19 | Disposition: A | Discharge status: Home / Self Care | Payer: Medicare Other | Source: Ambulatory Visit | Attending: Family Medicine | Admitting: Family Medicine

## 2013-04-19 ENCOUNTER — Other Ambulatory Visit (HOSPITAL_COMMUNITY): Payer: Self-pay | Admitting: Family Medicine

## 2013-04-19 DIAGNOSIS — M549 Dorsalgia, unspecified: Secondary | ICD-10-CM

## 2013-04-19 DIAGNOSIS — M79609 Pain in unspecified limb: Secondary | ICD-10-CM | POA: Insufficient documentation

## 2013-04-19 DIAGNOSIS — M545 Low back pain, unspecified: Secondary | ICD-10-CM | POA: Insufficient documentation

## 2013-04-19 DIAGNOSIS — M899 Disorder of bone, unspecified: Secondary | ICD-10-CM | POA: Insufficient documentation

## 2013-04-20 ENCOUNTER — Other Ambulatory Visit: Payer: Self-pay | Admitting: Cardiovascular Disease

## 2013-04-20 ENCOUNTER — Other Ambulatory Visit (HOSPITAL_COMMUNITY): Payer: Self-pay | Admitting: Family Medicine

## 2013-04-20 DIAGNOSIS — M545 Low back pain: Secondary | ICD-10-CM

## 2013-04-25 ENCOUNTER — Other Ambulatory Visit (HOSPITAL_COMMUNITY): Payer: Medicare Other

## 2013-04-29 ENCOUNTER — Ambulatory Visit
Admission: RE | Admit: 2013-04-29 | Discharge: 2013-04-29 | Disposition: A | Discharge status: Home / Self Care | Payer: Medicare Other | Source: Ambulatory Visit | Attending: Family Medicine | Admitting: Family Medicine

## 2013-04-29 DIAGNOSIS — M545 Low back pain: Secondary | ICD-10-CM

## 2013-05-02 ENCOUNTER — Other Ambulatory Visit: Payer: Self-pay | Admitting: Family Medicine

## 2013-05-02 DIAGNOSIS — M549 Dorsalgia, unspecified: Secondary | ICD-10-CM

## 2013-05-03 ENCOUNTER — Other Ambulatory Visit: Payer: Self-pay | Admitting: Family Medicine

## 2013-05-03 ENCOUNTER — Ambulatory Visit
Admission: RE | Admit: 2013-05-03 | Discharge: 2013-05-03 | Disposition: A | Discharge status: Home / Self Care | Payer: Medicare Other | Source: Ambulatory Visit | Attending: Family Medicine | Admitting: Family Medicine

## 2013-05-03 DIAGNOSIS — M549 Dorsalgia, unspecified: Secondary | ICD-10-CM

## 2013-05-03 MED ORDER — METHYLPREDNISOLONE ACETATE 40 MG/ML INJ SUSP (RADIOLOG
120.0000 mg | Freq: Once | INTRAMUSCULAR | Status: AC
  Administered 2013-05-03: 120 mg via EPIDURAL

## 2013-05-03 MED ORDER — IOHEXOL 180 MG/ML  SOLN
1.0000 mL | Freq: Once | INTRAMUSCULAR | Status: AC | PRN
  Administered 2013-05-03: 1 mL via EPIDURAL

## 2013-05-20 ENCOUNTER — Emergency Department (HOSPITAL_COMMUNITY): Payer: Medicare Other

## 2013-05-20 ENCOUNTER — Emergency Department (HOSPITAL_COMMUNITY)
Admission: EM | Admit: 2013-05-20 | Discharge: 2013-05-20 | Discharge status: Against Medical Advice | Payer: Medicare Other | Attending: Emergency Medicine | Admitting: Emergency Medicine

## 2013-05-20 ENCOUNTER — Encounter (HOSPITAL_COMMUNITY): Payer: Self-pay | Admitting: Emergency Medicine

## 2013-05-20 DIAGNOSIS — E785 Hyperlipidemia, unspecified: Secondary | ICD-10-CM | POA: Insufficient documentation

## 2013-05-20 DIAGNOSIS — Y9389 Activity, other specified: Secondary | ICD-10-CM | POA: Insufficient documentation

## 2013-05-20 DIAGNOSIS — I252 Old myocardial infarction: Secondary | ICD-10-CM | POA: Insufficient documentation

## 2013-05-20 DIAGNOSIS — S298XXA Other specified injuries of thorax, initial encounter: Secondary | ICD-10-CM | POA: Insufficient documentation

## 2013-05-20 DIAGNOSIS — I10 Essential (primary) hypertension: Secondary | ICD-10-CM | POA: Insufficient documentation

## 2013-05-20 DIAGNOSIS — J4489 Other specified chronic obstructive pulmonary disease: Secondary | ICD-10-CM | POA: Insufficient documentation

## 2013-05-20 DIAGNOSIS — I2510 Atherosclerotic heart disease of native coronary artery without angina pectoris: Secondary | ICD-10-CM | POA: Insufficient documentation

## 2013-05-20 DIAGNOSIS — Z9861 Coronary angioplasty status: Secondary | ICD-10-CM | POA: Insufficient documentation

## 2013-05-20 DIAGNOSIS — Y92410 Unspecified street and highway as the place of occurrence of the external cause: Secondary | ICD-10-CM | POA: Insufficient documentation

## 2013-05-20 DIAGNOSIS — IMO0002 Reserved for concepts with insufficient information to code with codable children: Secondary | ICD-10-CM | POA: Insufficient documentation

## 2013-05-20 DIAGNOSIS — Z880 Allergy status to penicillin: Secondary | ICD-10-CM | POA: Insufficient documentation

## 2013-05-20 DIAGNOSIS — J449 Chronic obstructive pulmonary disease, unspecified: Secondary | ICD-10-CM | POA: Insufficient documentation

## 2013-05-20 DIAGNOSIS — Z8739 Personal history of other diseases of the musculoskeletal system and connective tissue: Secondary | ICD-10-CM | POA: Insufficient documentation

## 2013-05-20 DIAGNOSIS — Z7902 Long term (current) use of antithrombotics/antiplatelets: Secondary | ICD-10-CM | POA: Insufficient documentation

## 2013-05-20 DIAGNOSIS — Z79899 Other long term (current) drug therapy: Secondary | ICD-10-CM | POA: Insufficient documentation

## 2013-05-20 DIAGNOSIS — Z8701 Personal history of pneumonia (recurrent): Secondary | ICD-10-CM | POA: Insufficient documentation

## 2013-05-20 DIAGNOSIS — F17200 Nicotine dependence, unspecified, uncomplicated: Secondary | ICD-10-CM | POA: Insufficient documentation

## 2013-05-20 DIAGNOSIS — Z8673 Personal history of transient ischemic attack (TIA), and cerebral infarction without residual deficits: Secondary | ICD-10-CM | POA: Insufficient documentation

## 2013-05-20 DIAGNOSIS — R0789 Other chest pain: Secondary | ICD-10-CM

## 2013-05-20 HISTORY — DX: Other chronic pain: G89.29

## 2013-05-20 HISTORY — DX: Personal history of other medical treatment: Z92.89

## 2013-05-20 HISTORY — DX: Other intervertebral disc degeneration, lumbar region: M51.36

## 2013-05-20 HISTORY — DX: Dorsalgia, unspecified: M54.9

## 2013-05-20 HISTORY — DX: Dizziness and giddiness: R42

## 2013-05-20 HISTORY — DX: Other intervertebral disc degeneration, lumbar region without mention of lumbar back pain or lower extremity pain: M51.369

## 2013-05-20 MED ORDER — OXYCODONE-ACETAMINOPHEN 5-325 MG PO TABS: ORAL_TABLET | ORAL | Status: AC

## 2013-05-20 MED ORDER — METHOCARBAMOL 500 MG PO TABS: 1000.0000 mg | ORAL_TABLET | Freq: Four times a day (QID) | ORAL | Status: AC | PRN

## 2013-05-20 MED ORDER — OXYCODONE-ACETAMINOPHEN 5-325 MG PO TABS
2.0000 | ORAL_TABLET | Freq: Once | ORAL | Status: AC
  Administered 2013-05-20: 2 via ORAL
  Filled 2013-05-20: qty 2
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
## 2013-05-20 NOTE — ED Notes (Signed)
Patient with no complaints at this time. Respirations even and unlabored. Skin warm/dry. Discharge instructions reviewed with patient at this time. Patient given opportunity to voice concerns/ask questions. IV removed per policy and band-aid applied to site. Patient discharged at this time and left Emergency Department with steady gait.  

## 2013-05-20 NOTE — ED Notes (Signed)
Chest pain worse with movement and inspiration.

## 2013-06-06 ENCOUNTER — Ambulatory Visit (HOSPITAL_COMMUNITY)
Admission: RE | Admit: 2013-06-06 | Discharge: 2013-06-06 | Disposition: A | Discharge status: Home / Self Care | Payer: Medicare Other | Source: Ambulatory Visit | Attending: Family Medicine | Admitting: Family Medicine

## 2013-06-06 ENCOUNTER — Other Ambulatory Visit (HOSPITAL_COMMUNITY): Payer: Self-pay | Admitting: Family Medicine

## 2013-06-06 DIAGNOSIS — R079 Chest pain, unspecified: Secondary | ICD-10-CM | POA: Insufficient documentation

## 2013-06-06 DIAGNOSIS — R05 Cough: Secondary | ICD-10-CM

## 2013-06-06 DIAGNOSIS — J984 Other disorders of lung: Secondary | ICD-10-CM | POA: Insufficient documentation

## 2013-06-08 ENCOUNTER — Other Ambulatory Visit (HOSPITAL_COMMUNITY): Payer: Self-pay | Admitting: Family Medicine

## 2013-06-08 DIAGNOSIS — R9389 Abnormal findings on diagnostic imaging of other specified body structures: Secondary | ICD-10-CM

## 2013-06-08 DIAGNOSIS — J984 Other disorders of lung: Secondary | ICD-10-CM

## 2013-06-12 ENCOUNTER — Ambulatory Visit (HOSPITAL_COMMUNITY)
Admission: RE | Admit: 2013-06-12 | Discharge: 2013-06-12 | Disposition: A | Discharge status: Home / Self Care | Payer: Medicare Other | Source: Ambulatory Visit | Attending: Family Medicine | Admitting: Family Medicine

## 2013-06-12 DIAGNOSIS — S2220XA Unspecified fracture of sternum, initial encounter for closed fracture: Secondary | ICD-10-CM | POA: Insufficient documentation

## 2013-06-12 DIAGNOSIS — J438 Other emphysema: Secondary | ICD-10-CM | POA: Insufficient documentation

## 2013-06-12 DIAGNOSIS — J984 Other disorders of lung: Secondary | ICD-10-CM

## 2013-06-12 DIAGNOSIS — R9389 Abnormal findings on diagnostic imaging of other specified body structures: Secondary | ICD-10-CM

## 2013-06-12 DIAGNOSIS — R079 Chest pain, unspecified: Secondary | ICD-10-CM | POA: Insufficient documentation

## 2013-06-22 ENCOUNTER — Ambulatory Visit: Payer: Medicare Other | Admitting: Orthopedic Surgery

## 2013-06-28 ENCOUNTER — Other Ambulatory Visit (HOSPITAL_COMMUNITY): Payer: Self-pay | Admitting: Family Medicine

## 2013-06-28 ENCOUNTER — Ambulatory Visit (HOSPITAL_COMMUNITY)
Admission: RE | Admit: 2013-06-28 | Discharge: 2013-06-28 | Disposition: A | Discharge status: Home / Self Care | Payer: Medicare Other | Source: Ambulatory Visit | Attending: Family Medicine | Admitting: Family Medicine

## 2013-06-28 DIAGNOSIS — R0989 Other specified symptoms and signs involving the circulatory and respiratory systems: Secondary | ICD-10-CM | POA: Insufficient documentation

## 2013-06-28 DIAGNOSIS — J4489 Other specified chronic obstructive pulmonary disease: Secondary | ICD-10-CM | POA: Insufficient documentation

## 2013-06-28 DIAGNOSIS — R059 Cough, unspecified: Secondary | ICD-10-CM

## 2013-06-28 DIAGNOSIS — R0789 Other chest pain: Secondary | ICD-10-CM

## 2013-06-28 DIAGNOSIS — R05 Cough: Secondary | ICD-10-CM

## 2013-06-28 DIAGNOSIS — J449 Chronic obstructive pulmonary disease, unspecified: Secondary | ICD-10-CM | POA: Insufficient documentation

## 2013-09-19 ENCOUNTER — Other Ambulatory Visit: Payer: Self-pay | Admitting: Vascular Surgery

## 2013-09-19 DIAGNOSIS — I6529 Occlusion and stenosis of unspecified carotid artery: Secondary | ICD-10-CM

## 2014-02-18 IMAGING — NM NM MYOCAR SINGLE W/SPECT W/WALL MOTION & EF
1 series · 6 of 6 positions shown · non-contrast
Comparison: none

Ordering Physician: MARIA DEL PRADO LACATUS

[place] Physician: [REDACTED]al Data: 57-year-old woman with history of CAD status post
previous DES times three to the RCA in 0336 in the setting of
myocardial infarction.  This study is requested to evaluate for the
presence and extent of ischemia.
NUCLEAR MEDICINE STRESS MYOVIEW STUDY WITH SPECT AND LEFT
VENTRICULAR EJECTION FRACTION
Radionuclide Data: One-day rest/stress protocol performed with
[DATE] mCi of 8c-11m Myoview.
Stress Data: Lexiscan bolus was given in standard fashion.  Heart
rate increased from 63 beats per minute up to 121 beats per minute,
and blood pressure increased from 105/69 up to 146/82.  The patient
tolerated infusion well.  No chest pain reported.  No clearly
diagnostic ST-segment abnormalities were noted by standard
criteria, and there were no sustained arrhythmias.
EKG: Baseline tracing shows sinus rhythm, possible old inferior
infarct pattern.
Scintigraphic Data: Analysis of the raw perfusion data finds breast
attenuation.
Tomographic views were obtained using the short axis, vertical long
axis, and horizontal long axis planes.  There is a small, mild
intensity defect in the apical anteroseptal wall.  This region is
fixed from stress to rest.  Most consistent with soft tissue
attenuation.  No clear evidence of ischemia.
Gated imaging reveals an EDV of 63, ESV of 16, T I D ratio 1.06,
and LVEF 74% without focal wall motion abnormality.

[cr cardiac tc low dose · 6.41mm/px · 6 of 64 frames shown]
[frame 6/64]
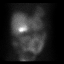
[frame 16/64]
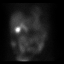
[frame 27/64]
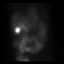
[frame 38/64]
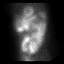
[frame 48/64]
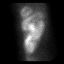
[frame 59/64]
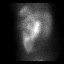

[6 of 6 positions shown; findings below may reference images not displayed]

IMPRESSION: Low risk Lexiscan Myoview.  There were no diagnostic ST-segment
abnormalities, no arrhythmias.  Perfusion imaging is consistent
with soft tissue attenuation in the anteroseptal distribution, no
clear evidence of ischemia.  LV volumes are normal, and LVEF is
74%.

## 2014-03-02 ENCOUNTER — Encounter: Payer: Self-pay | Admitting: Family

## 2014-03-05 ENCOUNTER — Inpatient Hospital Stay (HOSPITAL_COMMUNITY): Admission: RE | Admit: 2014-03-05 | Payer: Medicare Other | Source: Ambulatory Visit

## 2014-03-05 ENCOUNTER — Ambulatory Visit: Payer: Medicare Other | Admitting: Family
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
